# Patient Record
Sex: Male | Born: 1940 | Race: White | Hispanic: No | Marital: Single | Smoking: Current some day smoker
Health system: Southern US, Community
[De-identification: ages and names within clinical notes are randomized; demographics above are authoritative.]

## PROBLEM LIST (undated history)

## (undated) DIAGNOSIS — R6510 Systemic inflammatory response syndrome (SIRS) of non-infectious origin without acute organ dysfunction: Secondary | ICD-10-CM

## (undated) DIAGNOSIS — R5381 Other malaise: Secondary | ICD-10-CM

## (undated) DIAGNOSIS — E871 Hypo-osmolality and hyponatremia: Secondary | ICD-10-CM

## (undated) DIAGNOSIS — I10 Essential (primary) hypertension: Secondary | ICD-10-CM

## (undated) DIAGNOSIS — I509 Heart failure, unspecified: Secondary | ICD-10-CM

## (undated) DIAGNOSIS — A4901 Methicillin susceptible Staphylococcus aureus infection, unspecified site: Secondary | ICD-10-CM

## (undated) DIAGNOSIS — I739 Peripheral vascular disease, unspecified: Secondary | ICD-10-CM

## (undated) DIAGNOSIS — E872 Acidosis, unspecified: Secondary | ICD-10-CM

## (undated) DIAGNOSIS — J189 Pneumonia, unspecified organism: Secondary | ICD-10-CM

## (undated) DIAGNOSIS — F1020 Alcohol dependence, uncomplicated: Secondary | ICD-10-CM

## (undated) DIAGNOSIS — E876 Hypokalemia: Secondary | ICD-10-CM

## (undated) DIAGNOSIS — L97909 Non-pressure chronic ulcer of unspecified part of unspecified lower leg with unspecified severity: Secondary | ICD-10-CM

## (undated) DIAGNOSIS — J449 Chronic obstructive pulmonary disease, unspecified: Secondary | ICD-10-CM

## (undated) DIAGNOSIS — A419 Sepsis, unspecified organism: Secondary | ICD-10-CM

## (undated) HISTORY — DX: Pneumonia, unspecified organism: J18.9

## (undated) HISTORY — DX: Acidosis, unspecified: E87.20

## (undated) HISTORY — DX: Other malaise: R53.81

## (undated) HISTORY — DX: Hypokalemia: E87.6

## (undated) HISTORY — DX: Heart failure, unspecified: I50.9

## (undated) HISTORY — PX: BELOW KNEE LEG AMPUTATION: SUR23

## (undated) HISTORY — DX: Non-pressure chronic ulcer of unspecified part of unspecified lower leg with unspecified severity: L97.909

## (undated) HISTORY — DX: Chronic obstructive pulmonary disease, unspecified: J44.9

## (undated) HISTORY — DX: Sepsis, unspecified organism: A41.9

## (undated) HISTORY — DX: Systemic inflammatory response syndrome (sirs) of non-infectious origin without acute organ dysfunction: R65.10

## (undated) HISTORY — DX: Methicillin susceptible Staphylococcus aureus infection, unspecified site: A49.01

## (undated) HISTORY — DX: Alcohol dependence, uncomplicated: F10.20

## (undated) HISTORY — DX: Peripheral vascular disease, unspecified: I73.9

## (undated) HISTORY — DX: Hypo-osmolality and hyponatremia: E87.1

## (undated) HISTORY — DX: Acidosis: E87.2

## (undated) HISTORY — DX: Essential (primary) hypertension: I10

---

## 2009-07-30 ENCOUNTER — Inpatient Hospital Stay: Payer: Self-pay | Admitting: Student

## 2011-02-07 ENCOUNTER — Inpatient Hospital Stay: Payer: Self-pay | Admitting: Internal Medicine

## 2011-02-07 DIAGNOSIS — R0602 Shortness of breath: Secondary | ICD-10-CM

## 2011-02-07 DIAGNOSIS — I059 Rheumatic mitral valve disease, unspecified: Secondary | ICD-10-CM

## 2011-02-07 DIAGNOSIS — R7989 Other specified abnormal findings of blood chemistry: Secondary | ICD-10-CM

## 2011-02-26 ENCOUNTER — Ambulatory Visit: Payer: Self-pay | Admitting: Cardiovascular Disease

## 2011-07-06 ENCOUNTER — Emergency Department: Payer: Self-pay | Admitting: Emergency Medicine

## 2011-08-02 ENCOUNTER — Encounter: Payer: Self-pay | Admitting: Cardiovascular Disease

## 2011-08-02 ENCOUNTER — Ambulatory Visit (INDEPENDENT_AMBULATORY_CARE_PROVIDER_SITE_OTHER): Payer: Medicare Other | Admitting: Cardiovascular Disease

## 2011-08-02 VITALS — BP 141/75 | HR 76 | Ht 72.0 in | Wt 200.0 lb

## 2011-08-02 DIAGNOSIS — E119 Type 2 diabetes mellitus without complications: Secondary | ICD-10-CM | POA: Insufficient documentation

## 2011-08-02 DIAGNOSIS — I252 Old myocardial infarction: Secondary | ICD-10-CM

## 2011-08-02 DIAGNOSIS — I238 Other current complications following acute myocardial infarction: Secondary | ICD-10-CM

## 2011-08-02 DIAGNOSIS — I236 Thrombosis of atrium, auricular appendage, and ventricle as current complications following acute myocardial infarction: Secondary | ICD-10-CM | POA: Insufficient documentation

## 2011-08-02 DIAGNOSIS — E785 Hyperlipidemia, unspecified: Secondary | ICD-10-CM

## 2011-08-02 DIAGNOSIS — I2510 Atherosclerotic heart disease of native coronary artery without angina pectoris: Secondary | ICD-10-CM

## 2011-08-02 MED ORDER — ISOSORBIDE MONONITRATE ER 30 MG PO TB24: 30.0000 mg | ORAL_TABLET | Freq: Every day | ORAL | Status: DC

## 2011-08-02 NOTE — Patient Instructions (Signed)
Please start the isosorbide one a day Please call us if you have new issues that need to be addressed before your next appt.  We will call you for a follow up Appt. In 3 months

## 2011-08-02 NOTE — Assessment & Plan Note (Signed)
We have encouraged continued exercise, careful diet management in an effort to lose weight. 

## 2011-08-02 NOTE — Assessment & Plan Note (Signed)
Severe three-vessel coronary artery disease. He refused bypass surgery. We'll try to obtain the cardiac catheter films from [facility] to determine if any intervention can be performed. Currently he is without significant symptoms though he is relatively immobile. We will start isosorbide mononitrate for blood pressure and for severe disease.

## 2011-08-02 NOTE — Assessment & Plan Note (Signed)
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

## 2011-08-02 NOTE — Assessment & Plan Note (Signed)
Occluded mid LAD. Occluded diagonal vessel. Ejection fraction 30% with akinesis of the apical wall and thrombus formation. Currently on warfarin. He is on spironolactone, ACE inhibitor, beta blocker.

## 2011-08-02 NOTE — Assessment & Plan Note (Signed)
He currently on Lipitor 80 mg daily. He will need a cholesterol check in several months time.

## 2011-08-02 NOTE — Progress Notes (Signed)
Patient ID: Strider Vallance, male    DOB: 08-24-1941, 70 y.o.   MRN: [id number]  HPI Comments: Mr. Goin is a pleasant 70 year old gentleman with a long history of diabetes, smoking who presented to [facility] with chest discomfort, transferred to [facility] where he underwent numerous tests, including cardiac catheterization that showed severe three-vessel disease. He presents to our office to establish care.  Cardiac catheter details show moderate diffuse RCA disease with severe distal RCA disease, 90% mid left circumflex disease with diffusely diseased OM1 disease, 60% disease of an OM 2 vessel as large, occluded mid LAD, occluded D1, 90% D2 disease. Ejection fraction 30%.  He was also found to have significant atherosclerotic disease of the abdominal aorta.  Also found to have severe left subclavian stenosis ( significant blood pressure drop on the left compared to the right)  He declined bypass surgery as he has had an amputation in the past and is trying to avoid additional invasive surgery. Stenting was not offered. He was found to have moderate LVH there was concentric, apical thrombus on echo, normal right ventricular systolic pressures  Currently he does not have significant symptoms of chest pain or shortness of breath. Is not very active given his amputation. He does not wear his leg prosthesis for a much and spends most of his time sitting  EKG shows normal sinus rhythm with rate 77 beats per minute with old anterior infarct, ST and T wave abnormality in leads V5, V6, 2, 3, aVF   Outpatient Encounter Prescriptions as of 08/02/2011  Medication Sig Dispense Refill  . aspirin 81 MG tablet Take 81 mg by mouth daily.        Marland Kitchen atorvastatin (LIPITOR) 80 MG tablet Take 80 mg by mouth daily.        . beta carotene 08657 UNIT capsule Take 25,000 Units by mouth daily.        . carvedilol (COREG) 6.25 MG tablet Take 6.25 mg by mouth 2 (two) times daily with a meal.        . cholecalciferol  (VITAMIN D) 1000 UNITS tablet Take 1,000 Units by mouth daily.        Marland Kitchen docusate sodium (COLACE) 100 MG capsule Take 100 mg by mouth 2 (two) times daily.        . enalapril (VASOTEC) 10 MG tablet Take 10 mg by mouth daily.        Marland Kitchen enoxaparin (LOVENOX) 80 MG/0.8ML SOLN Inject into the skin every 12 (twelve) hours. For 5 days.       . furosemide (LASIX) 40 MG tablet Take 40 mg by mouth daily.        Marland Kitchen glipiZIDE (GLUCOTROL) 10 MG 24 hr tablet Take 10 mg by mouth daily.        . metFORMIN (GLUCOPHAGE) 500 MG tablet Take 500 mg by mouth 2 (two) times daily with a meal.        . nicotine (NICODERM CQ - DOSED IN MG/24 HOURS) 14 mg/24hr patch Place 1 patch onto the skin daily.        . nitroGLYCERIN (NITROSTAT) 0.4 MG SL tablet Place 0.4 mg under the tongue every 5 (five) minutes as needed.        Marland Kitchen spironolactone (ALDACTONE) 25 MG tablet Take 25 mg by mouth daily.        Marland Kitchen warfarin (COUMADIN) 7.5 MG tablet Take 7.5 mg by mouth as directed.           Review of Systems  Constitutional: Negative.   HENT: Negative.   Eyes: Negative.   Respiratory: Positive for shortness of breath.   Cardiovascular: Negative.   Gastrointestinal: Negative.   Musculoskeletal: Positive for gait problem.  Skin: Negative.   Neurological: Negative.   Hematological: Negative.   Psychiatric/Behavioral: Negative.   All other systems reviewed and are negative.    BP 141/75  Pulse 76  Ht 6' (1.829 m)  Wt 200 lb (90.719 kg)  BMI 27.12 kg/m2  Physical Exam  Nursing note and vitals reviewed. Constitutional: He is oriented to person, place, and time. He appears well-developed and well-nourished.       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  Nose: Nose normal.  Mouth/Throat: Oropharynx is clear and moist.  Eyes: Conjunctivae are normal. Pupils are equal, round, and reactive to light.  Neck: Normal range of motion. Neck supple. No JVD  present.  Cardiovascular: Normal rate, regular rhythm, S1 normal, S2 normal, normal heart sounds and intact distal pulses.  Exam reveals no gallop and no friction rub.   No murmur heard. Pulses:      Carotid pulses are 2+ on the right side, and 2+ on the left side.      Radial pulses are 2+ on the right side, and 1+ on the left side.       Dorsalis pedis pulses are 1+ on the right side, and 1+ on the left side.       Posterior tibial pulses are 1+ on the right side, and 1+ on the left side.  Pulmonary/Chest: Effort normal and breath sounds normal. No respiratory distress. He has no wheezes. He has no rales. He exhibits no tenderness.  Abdominal: Soft. Bowel sounds are normal. He exhibits no distension. There is no tenderness.  Musculoskeletal: Normal range of motion. He exhibits no edema and no tenderness.  Lymphadenopathy:    He has no cervical adenopathy.  Neurological: He is alert and oriented to person, place, and time. Coordination normal.  Skin: Skin is warm and dry. No rash noted. No erythema.  Psychiatric: He has a normal mood and affect. His behavior is normal. Judgment and thought content normal.           Assessment and Plan

## 2011-11-28 ENCOUNTER — Encounter: Payer: Self-pay | Admitting: Cardiovascular Disease

## 2011-11-28 ENCOUNTER — Inpatient Hospital Stay: Payer: Self-pay | Admitting: Student

## 2011-11-29 ENCOUNTER — Encounter: Payer: Self-pay | Admitting: Cardiovascular Disease

## 2011-11-29 DIAGNOSIS — R079 Chest pain, unspecified: Secondary | ICD-10-CM

## 2011-11-29 DIAGNOSIS — R0602 Shortness of breath: Secondary | ICD-10-CM

## 2011-11-30 ENCOUNTER — Encounter: Payer: Self-pay | Admitting: Cardiovascular Disease

## 2011-11-30 DIAGNOSIS — I2510 Atherosclerotic heart disease of native coronary artery without angina pectoris: Secondary | ICD-10-CM

## 2011-12-04 ENCOUNTER — Telehealth: Payer: Self-pay | Admitting: Cardiovascular Disease

## 2011-12-04 NOTE — Telephone Encounter (Signed)
Pt wants Dr Mariah Milling to call in all meds. Dr Mariah Milling told pt he would if he needed them. All generic sent to [facility] in [place]    Coreg Lasix Spironolactone  Coumadin

## 2011-12-05 MED ORDER — FUROSEMIDE 40 MG PO TABS: 40.0000 mg | ORAL_TABLET | Freq: Every day | ORAL | Status: DC

## 2011-12-05 MED ORDER — CARVEDILOL 6.25 MG PO TABS: 6.2500 mg | ORAL_TABLET | Freq: Two times a day (BID) | ORAL | Status: DC

## 2011-12-05 MED ORDER — SPIRONOLACTONE 25 MG PO TABS: 25.0000 mg | ORAL_TABLET | Freq: Every day | ORAL | Status: DC

## 2011-12-05 MED ORDER — WARFARIN SODIUM 7.5 MG PO TABS: 7.5000 mg | ORAL_TABLET | ORAL | Status: DC

## 2011-12-05 NOTE — Telephone Encounter (Signed)
Dr. Mariah Milling saw pt in hospital, he verified that I can send in Rx refills. Per TG have also scheduled f/u here post hospital. We have asked that he sign release for cardiac cath from [facility], we will need them to mail to our office, pt says he is not going down there any more.

## 2011-12-06 ENCOUNTER — Telehealth: Payer: Self-pay | Admitting: Cardiovascular Disease

## 2011-12-06 NOTE — Telephone Encounter (Signed)
Has a question regarding his Coumadin dosage

## 2011-12-06 NOTE — Telephone Encounter (Signed)
Patient thinks that his Coumadin dosage may be incorrect.  He wants you to call him back to make sure he is taking the right dosage.

## 2011-12-06 NOTE — Telephone Encounter (Signed)
Pt was changed to 4mg  Coumadin in [facility], he restarted on 12/04/11. Pt just got his refill and got the 7.5 mg tablets, he is asking what to take. Per Dr. Mariah Milling, advised pt ok to take 1/2 tab of the 7.5mg  daily and spoke to Dr. Doristine Church office at [facility] who normally follow's pt's coumadin. They know that Dr. Mariah Milling wants his range lower since hematuria in hospital. Pt will f/u at Dr. Doristine Church office.

## 2011-12-27 ENCOUNTER — Encounter: Payer: Self-pay | Admitting: Cardiovascular Disease

## 2011-12-27 ENCOUNTER — Ambulatory Visit (INDEPENDENT_AMBULATORY_CARE_PROVIDER_SITE_OTHER): Payer: Medicare Other | Admitting: Cardiovascular Disease

## 2011-12-27 DIAGNOSIS — I238 Other current complications following acute myocardial infarction: Secondary | ICD-10-CM

## 2011-12-27 DIAGNOSIS — I2510 Atherosclerotic heart disease of native coronary artery without angina pectoris: Secondary | ICD-10-CM

## 2011-12-27 DIAGNOSIS — E119 Type 2 diabetes mellitus without complications: Secondary | ICD-10-CM

## 2011-12-27 DIAGNOSIS — I252 Old myocardial infarction: Secondary | ICD-10-CM

## 2011-12-27 DIAGNOSIS — E785 Hyperlipidemia, unspecified: Secondary | ICD-10-CM

## 2011-12-27 DIAGNOSIS — R319 Hematuria, unspecified: Secondary | ICD-10-CM | POA: Insufficient documentation

## 2011-12-27 DIAGNOSIS — I236 Thrombosis of atrium, auricular appendage, and ventricle as current complications following acute myocardial infarction: Secondary | ICD-10-CM

## 2011-12-27 DIAGNOSIS — I739 Peripheral vascular disease, unspecified: Secondary | ICD-10-CM | POA: Insufficient documentation

## 2011-12-27 MED ORDER — SPIRONOLACTONE 25 MG PO TABS: 25.0000 mg | ORAL_TABLET | Freq: Every day | ORAL | Status: DC

## 2011-12-27 MED ORDER — ATORVASTATIN CALCIUM 80 MG PO TABS: 80.0000 mg | ORAL_TABLET | Freq: Every day | ORAL | Status: DC

## 2011-12-27 MED ORDER — WARFARIN SODIUM 4 MG PO TABS: 4.0000 mg | ORAL_TABLET | Freq: Every day | ORAL | Status: AC

## 2011-12-27 MED ORDER — ENALAPRIL MALEATE 10 MG PO TABS: 10.0000 mg | ORAL_TABLET | Freq: Two times a day (BID) | ORAL | Status: DC

## 2011-12-27 NOTE — Assessment & Plan Note (Signed)
We have encouraged  careful diet management   

## 2011-12-27 NOTE — Assessment & Plan Note (Signed)
He had recent hematuria on warfarin, 2 weeks, stuttering prior to his recent admission to [facility] in early January. We have decreased the warfarin in half to 4 mg daily with aspirin.

## 2011-12-27 NOTE — Progress Notes (Signed)
Patient ID: Kyle [facility], male    DOB: 09/16/1941, 72 y.o.   MRN: [id number]  HPI Comments: Kyle Duke is a pleasant 71 year old gentleman with a long history of diabetes, smoking who presented to [facility] with chest discomfort, transferred to [facility] where he underwent numerous tests, including cardiac catheterization that showed severe three-vessel disease. He presented to [facility] and was admitted at the beginning of January 2013 for hematuria, shortness of breath, better on oxygen. He was started on heparin and was noted to have significant hematuria. Heparin was discontinued and his warfarin was cut in half and his hematuria resolved. He had a non-STEMI, sinus tachycardia on arrival. Hemoglobin dropped as low as 8.4. Followup troponin was 5.2. He was discharged with medical management given severe three-vessel disease by catheterization at [facility] with recommendation at that time for medical management. I believe he declined bypass surgery. He was found to have an apical thrombus on echo.  Cardiac catheter details show moderate diffuse RCA disease with severe distal RCA disease, 90% mid left circumflex disease with diffusely diseased OM1 disease, 60% disease of an OM 2 vessel as large, occluded mid LAD, occluded D1, 90% D2 disease. Ejection fraction 30%. He was also found to have significant atherosclerotic disease of the abdominal aorta. Also found to have severe left subclavian stenosis ( significant blood pressure drop on the left compared to the right)  He declined bypass surgery as he has had an amputation in the past and is trying to avoid additional invasive surgery. Stenting was not offered. He was found to have moderate LVH there was concentric, apical thrombus on echo, normal right ventricular systolic pressures  Currently he does not have significant symptoms of chest pain or shortness of breath. Is not very active given his amputation. He does not wear his leg prosthesis for a much and  spends most of his time sitting. He denies any significant shortness of breath. He does have mild lower extremity edema on the left. He continues to smoke several cigarettes per day.  EKG shows normal sinus rhythm with rate 73 beats per minute with old anterior infarct, left anterior fascicular block  Echocardiogram in the hospital showed ejection fraction 35-40%, moderate to severe apical wall hypokinesis, moderate anterior wall hypokinesis, normal right ventricular systolic pressures    Outpatient Encounter Prescriptions as of 12/27/2011  Medication Sig Dispense Refill  . aspirin 81 MG tablet Take 160 mg by mouth daily.       Marland Kitchen atorvastatin (LIPITOR) 80 MG tablet Take 1 tablet (80 mg total) by mouth daily.  90 tablet  3  . beta carotene 78469 UNIT capsule Take 25,000 Units by mouth daily.        . carvedilol (COREG) 6.25 MG tablet Take 1 tablet (6.25 mg total) by mouth 2 (two) times daily with a meal.  60 tablet  6  . cholecalciferol (VITAMIN D) 1000 UNITS tablet Take 1,000 Units by mouth daily.        Marland Kitchen docusate sodium (COLACE) 100 MG capsule Take 100 mg by mouth 2 (two) times daily.        . enalapril (VASOTEC) 10 MG tablet Take 1 tablet (10 mg total) by mouth every 12 (twelve) hours.  180 tablet  3  . furosemide (LASIX) 40 MG tablet Take 1 tablet (40 mg total) by mouth daily.  30 tablet  6  . glipiZIDE (GLUCOTROL) 10 MG 24 hr tablet Take 10 mg by mouth daily.        Marland Kitchen  nitroGLYCERIN (NITROSTAT) 0.4 MG SL tablet Place 0.4 mg under the tongue every 5 (five) minutes as needed.        Marland Kitchen spironolactone (ALDACTONE) 25 MG tablet Take 1 tablet (25 mg total) by mouth daily.  90 tablet  3  . warfarin (COUMADIN) 4 MG tablet Take 1 tablet (4 mg total) by mouth daily.  90 tablet  3     Review of Systems  Constitutional: Negative.   HENT: Negative.   Eyes: Negative.   Cardiovascular: Negative.   Gastrointestinal: Negative.   Musculoskeletal: Positive for gait problem.  Skin: Negative.     Neurological: Negative.   Hematological: Negative.   Psychiatric/Behavioral: Negative.   All other systems reviewed and are negative.    BP 159/78  Pulse 73  Ht 6\' 2"  (1.88 m)  Wt 200 lb (90.719 kg)  BMI 25.68 kg/m2  SpO2 97%  Physical Exam  Nursing note and vitals reviewed. Constitutional: He is oriented to person, place, and time. He appears well-developed and well-nourished.       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  Nose: Nose normal.  Mouth/Throat: Oropharynx is clear and moist.  Eyes: Conjunctivae are normal. Pupils are equal, round, and reactive to light.  Neck: Normal range of motion. Neck supple. No JVD present.  Cardiovascular: Normal rate, regular rhythm, S1 normal, S2 normal, normal heart sounds and intact distal pulses.  Exam reveals no gallop and no friction rub.   No murmur heard. Pulses:      Carotid pulses are 2+ on the right side, and 2+ on the left side.      Radial pulses are 2+ on the right side, and 1+ on the left side.       Dorsalis pedis pulses are 1+ on the right side, and 1+ on the left side.       Posterior tibial pulses are 1+ on the right side, and 1+ on the left side.  Pulmonary/Chest: Effort normal. No respiratory distress. He has decreased breath sounds. He has no wheezes. He has no rales. He exhibits no tenderness.  Abdominal: Soft. Bowel sounds are normal. He exhibits no distension. There is no tenderness.  Musculoskeletal: Normal range of motion. He exhibits no edema and no tenderness.  Lymphadenopathy:    He has no cervical adenopathy.  Neurological: He is alert and oriented to person, place, and time. Coordination normal.  Skin: Skin is warm and dry. No rash noted. No erythema.  Psychiatric: He has a normal mood and affect. His behavior is normal. Judgment and thought content normal.           Assessment and Plan

## 2011-12-27 NOTE — Assessment & Plan Note (Signed)
He reports having carotid disease. Will try to obtain the most recent report from [facility] for records. Continue aggressive cholesterol management.

## 2011-12-27 NOTE — Assessment & Plan Note (Signed)
Discharge instructions indicate he is posterior half followup with Dr. Orson Slick at [facility]

## 2011-12-27 NOTE — Assessment & Plan Note (Signed)
Severe CAD, previous cardiac catheterization at [facility] with bypass surgery recommended. He declined bypass surgery. We have obtained a copy of his cardiac catheterization and will review this to determine if any percutaneous intervention could be performed given his recent non-STEMI.

## 2011-12-27 NOTE — Assessment & Plan Note (Signed)
I suggested he continue his cholesterol medication. Goal LDL less than 70.

## 2011-12-27 NOTE — Patient Instructions (Signed)
You are doing well. No medication changes were made.  Please call us if you have new issues that need to be addressed before your next appt.  Your physician wants you to follow-up in: 6 months.  You will receive a reminder letter in the mail two months in advance. If you don't receive a letter, please call our office to schedule the follow-up appointment.   

## 2012-06-25 ENCOUNTER — Ambulatory Visit (INDEPENDENT_AMBULATORY_CARE_PROVIDER_SITE_OTHER): Payer: Medicare Other | Admitting: Cardiovascular Disease

## 2012-06-25 ENCOUNTER — Encounter: Payer: Self-pay | Admitting: Cardiovascular Disease

## 2012-06-25 VITALS — BP 182/90 | HR 83 | Ht 74.0 in | Wt 170.0 lb

## 2012-06-25 DIAGNOSIS — I236 Thrombosis of atrium, auricular appendage, and ventricle as current complications following acute myocardial infarction: Secondary | ICD-10-CM

## 2012-06-25 DIAGNOSIS — I2510 Atherosclerotic heart disease of native coronary artery without angina pectoris: Secondary | ICD-10-CM

## 2012-06-25 DIAGNOSIS — R609 Edema, unspecified: Secondary | ICD-10-CM | POA: Insufficient documentation

## 2012-06-25 DIAGNOSIS — I238 Other current complications following acute myocardial infarction: Secondary | ICD-10-CM

## 2012-06-25 DIAGNOSIS — E785 Hyperlipidemia, unspecified: Secondary | ICD-10-CM

## 2012-06-25 MED ORDER — ENALAPRIL MALEATE 20 MG PO TABS: 20.0000 mg | ORAL_TABLET | Freq: Two times a day (BID) | ORAL | Status: DC

## 2012-06-25 MED ORDER — NEW SKIN EX AERO: INHALATION_SPRAY | CUTANEOUS | Status: AC | PRN

## 2012-06-25 MED ORDER — CARVEDILOL 12.5 MG PO TABS: 12.5000 mg | ORAL_TABLET | Freq: Two times a day (BID) | ORAL | Status: DC

## 2012-06-25 NOTE — Assessment & Plan Note (Signed)
We have suggested he stay on his Lipitor 80 mg daily

## 2012-06-25 NOTE — Assessment & Plan Note (Signed)
Continue on warfarin given previous apical thrombus.

## 2012-06-25 NOTE — Patient Instructions (Addendum)
You are doing well. Please increase the enalapril to 20 mg twice a day Increase the carvedilol to 12.5 mg twice a day  Try a compression hose for leg swelling  Please call us if you have new issues that need to be addressed before your next appt.  Your physician wants you to follow-up in: 6 months.  You will receive a reminder letter in the mail two months in advance. If you don't receive a letter, please call our office to schedule the follow-up appointment.

## 2012-06-25 NOTE — Progress Notes (Signed)
Patient ID: Kyle [facility], male    DOB: 1941/06/16, 71 y.o.   MRN: [id number]  HPI Comments: Mr. Werth is a pleasant 71 year old gentleman with a long history of diabetes, smoking who presented to [facility] with chest discomfort, transferred to [facility] where he underwent numerous tests, including cardiac catheterization that showed severe three-vessel disease. He presented to [facility] and was admitted at the beginning of January 2013 for hematuria, shortness of breath, better on oxygen. He was started on heparin and was noted to have significant hematuria. Heparin was discontinued and his warfarin was cut in half and his hematuria resolved. He had a non-STEMI, sinus tachycardia on arrival. Hemoglobin dropped as low as 8.4. Followup troponin was 5.2. He was discharged with medical management given severe three-vessel disease by catheterization at [facility] with recommendation at that time for medical management. I believe he declined bypass surgery. He was found to have an apical thrombus on echo.  Cardiac catheter details show moderate diffuse RCA disease with severe distal RCA disease, 90% mid left circumflex disease with diffusely diseased OM1 disease, 60% disease of an OM 2 vessel as large, occluded mid LAD, occluded D1, 90% D2 disease. Ejection fraction 30%. He was also found to have significant atherosclerotic disease of the abdominal aorta. Also found to have severe left subclavian stenosis ( significant blood pressure drop on the left compared to the right)  He declined bypass surgery as he has had an amputation in the past and is trying to avoid additional invasive surgery. Stenting was not offered. He was found to have moderate LVH there was concentric, apical thrombus on echo, normal right ventricular systolic pressures  Currently he does not have significant symptoms of chest pain or shortness of breath. Is not very active given his amputation. He does not wear his leg prosthesis for a much and  spends most of his time sitting.He does have mild lower extremity edema on the left that recently he has been getting mildly worse. He does have a healing ulcer on the left leg.  He continues to smoke several cigarettes per day.  EKG shows normal sinus rhythm with rate 84 beats per minute with old anterior infarct, left anterior fascicular block  Echocardiogram in the hospital showed ejection fraction 35-40%, moderate to severe apical wall hypokinesis, moderate anterior wall hypokinesis, normal right ventricular systolic pressures    Outpatient Encounter Prescriptions as of 12/27/2011  Medication Sig Dispense Refill  . aspirin 81 MG tablet Take 160 mg by mouth daily.       Marland Kitchen atorvastatin (LIPITOR) 80 MG tablet Take 1 tablet (80 mg total) by mouth daily.  90 tablet  3  . beta carotene 40981 UNIT capsule Take 25,000 Units by mouth daily.        . carvedilol (COREG) 6.25 MG tablet Take 1 tablet (6.25 mg total) by mouth 2 (two) times daily with a meal.  60 tablet  6  . cholecalciferol (VITAMIN D) 1000 UNITS tablet Take 1,000 Units by mouth daily.        Marland Kitchen docusate sodium (COLACE) 100 MG capsule Take 100 mg by mouth 2 (two) times daily.        . enalapril (VASOTEC) 10 MG tablet Take 1 tablet (10 mg total) by mouth every 12 (twelve) hours.  180 tablet  3  . furosemide (LASIX) 40 MG tablet Take 1 tablet (40 mg total) by mouth daily.  30 tablet  6  . glipiZIDE (GLUCOTROL) 10 MG 24 hr tablet Take 10  mg by mouth daily.        . nitroGLYCERIN (NITROSTAT) 0.4 MG SL tablet Place 0.4 mg under the tongue every 5 (five) minutes as needed.        Marland Kitchen spironolactone (ALDACTONE) 25 MG tablet Take 1 tablet (25 mg total) by mouth daily.  90 tablet  3  . warfarin (COUMADIN) 4 MG tablet Take 1 tablet (4 mg total) by mouth daily.  90 tablet  3     Review of Systems  Constitutional: Negative.   HENT: Negative.   Eyes: Negative.   Respiratory: Negative.   Cardiovascular: Positive for leg swelling.    Gastrointestinal: Negative.   Musculoskeletal: Positive for gait problem.  Skin: Negative.   Neurological: Negative.   Hematological: Negative.   Psychiatric/Behavioral: Negative.   All other systems reviewed and are negative.    BP 182/90  Pulse 83  Ht 6\' 2"  (1.88 m)  Wt 170 lb (77.111 kg)  BMI 21.83 kg/m2  Physical Exam  Nursing note and vitals reviewed. Constitutional: He is oriented to person, place, and time. He appears well-developed and well-nourished.       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  Nose: Nose normal.  Mouth/Throat: Oropharynx is clear and moist.  Eyes: Conjunctivae are normal. Pupils are equal, round, and reactive to light.  Neck: Normal range of motion. Neck supple. No JVD present.  Cardiovascular: Normal rate, regular rhythm, S1 normal, S2 normal, normal heart sounds and intact distal pulses.  Exam reveals no gallop and no friction rub.   No murmur heard. Pulses:      Carotid pulses are 2+ on the right side, and 2+ on the left side.      Radial pulses are 2+ on the right side, and 1+ on the left side.       Dorsalis pedis pulses are 1+ on the right side, and 1+ on the left side.       Posterior tibial pulses are 1+ on the right side, and 1+ on the left side.  Pulmonary/Chest: Effort normal. No respiratory distress. He has decreased breath sounds. He has no wheezes. He has no rales. He exhibits no tenderness.  Abdominal: Soft. Bowel sounds are normal. He exhibits no distension. There is no tenderness.  Musculoskeletal: Normal range of motion. He exhibits no edema and no tenderness.       Right leg amputation below the knee  Lymphadenopathy:    He has no cervical adenopathy.  Neurological: He is alert and oriented to person, place, and time. Coordination normal.  Skin: Skin is warm and dry. No rash noted. No erythema.  Psychiatric: He has a normal mood and affect. His  behavior is normal. Judgment and thought content normal.           Assessment and Plan

## 2012-06-25 NOTE — Assessment & Plan Note (Signed)
We have suggested he wear a TED hose, leg elevation. Continue on his Lasix, decrease his salt and fluid intake. Edema likely from venous insufficiency.

## 2012-06-25 NOTE — Assessment & Plan Note (Signed)
Currently with no symptoms of angina. No further workup at this time. Continue current medication regimen. 

## 2012-08-22 ENCOUNTER — Other Ambulatory Visit: Payer: Self-pay

## 2012-08-22 MED ORDER — FUROSEMIDE 40 MG PO TABS: 40.0000 mg | ORAL_TABLET | Freq: Every day | ORAL | Status: DC

## 2012-08-22 NOTE — Telephone Encounter (Signed)
Refill sent for furosemide  

## 2012-08-22 NOTE — Telephone Encounter (Signed)
Refill sent for furosemide 40 mg take one tablet daily. 

## 2012-09-02 ENCOUNTER — Other Ambulatory Visit: Payer: Self-pay | Admitting: *Deleted

## 2012-09-02 MED ORDER — ISOSORBIDE MONONITRATE ER 30 MG PO TB24: 30.0000 mg | ORAL_TABLET | Freq: Every day | ORAL | Status: DC

## 2012-09-02 NOTE — Telephone Encounter (Signed)
Refilled Isosorbide. 

## 2012-12-04 ENCOUNTER — Other Ambulatory Visit: Payer: Self-pay | Admitting: *Deleted

## 2012-12-04 MED ORDER — ISOSORBIDE MONONITRATE ER 30 MG PO TB24: 30.0000 mg | ORAL_TABLET | Freq: Every day | ORAL | Status: DC

## 2012-12-04 MED ORDER — ATORVASTATIN CALCIUM 80 MG PO TABS: 80.0000 mg | ORAL_TABLET | Freq: Every day | ORAL | Status: DC

## 2012-12-04 NOTE — Telephone Encounter (Signed)
Refilled Atorvastatin sent to [facility].

## 2012-12-04 NOTE — Telephone Encounter (Signed)
Refilled Isosorbide. 

## 2013-01-02 ENCOUNTER — Ambulatory Visit: Payer: Medicare Other | Admitting: Cardiovascular Disease

## 2013-01-13 ENCOUNTER — Other Ambulatory Visit: Payer: Self-pay

## 2013-01-13 MED ORDER — SPIRONOLACTONE 25 MG PO TABS: 25.0000 mg | ORAL_TABLET | Freq: Every day | ORAL | Status: DC

## 2013-01-13 NOTE — Telephone Encounter (Signed)
Refill sent for spironolactone  

## 2013-01-16 ENCOUNTER — Ambulatory Visit: Payer: Medicare Other | Admitting: Cardiovascular Disease

## 2013-01-23 ENCOUNTER — Other Ambulatory Visit: Payer: Self-pay | Admitting: *Deleted

## 2013-01-27 ENCOUNTER — Telehealth: Payer: Self-pay

## 2013-01-27 ENCOUNTER — Other Ambulatory Visit: Payer: Self-pay

## 2013-01-27 MED ORDER — ENALAPRIL MALEATE 20 MG PO TABS: 20.0000 mg | ORAL_TABLET | Freq: Two times a day (BID) | ORAL | Status: DC

## 2013-01-27 NOTE — Telephone Encounter (Signed)
Sent a refill for enalapril 10 mg. Patient needs to come for a follow up appointment with Dr. Mariah Milling.  Patient called back to schedule appointment for February 12, 2013.

## 2013-01-27 NOTE — Telephone Encounter (Signed)
Refill sent for enalapril 10 mg

## 2013-02-23 ENCOUNTER — Ambulatory Visit (INDEPENDENT_AMBULATORY_CARE_PROVIDER_SITE_OTHER): Payer: Medicare Other | Admitting: Cardiovascular Disease

## 2013-02-23 ENCOUNTER — Encounter: Payer: Self-pay | Admitting: Cardiovascular Disease

## 2013-02-23 VITALS — BP 160/78 | HR 82 | Ht 74.0 in | Wt 180.0 lb

## 2013-02-23 DIAGNOSIS — E785 Hyperlipidemia, unspecified: Secondary | ICD-10-CM

## 2013-02-23 DIAGNOSIS — I238 Other current complications following acute myocardial infarction: Secondary | ICD-10-CM

## 2013-02-23 DIAGNOSIS — I2510 Atherosclerotic heart disease of native coronary artery without angina pectoris: Secondary | ICD-10-CM

## 2013-02-23 DIAGNOSIS — I10 Essential (primary) hypertension: Secondary | ICD-10-CM

## 2013-02-23 DIAGNOSIS — I236 Thrombosis of atrium, auricular appendage, and ventricle as current complications following acute myocardial infarction: Secondary | ICD-10-CM

## 2013-02-23 DIAGNOSIS — R609 Edema, unspecified: Secondary | ICD-10-CM

## 2013-02-23 NOTE — Assessment & Plan Note (Signed)
I've asked Him to closely monitor his blood pressure at home and call for systolic pressures greater than 40 on a regular basis

## 2013-02-23 NOTE — Assessment & Plan Note (Signed)
Edema has resolved with use of Lasix. We'll continue dose daily. He has lab work done through primary care physician. If creatinine and BUN started to climb, we have suggested he decrease the diuretic use

## 2013-02-23 NOTE — Assessment & Plan Note (Signed)
We have suggested he stay on his statin. Goal LDL less than 70. Most recent numbers are not available

## 2013-02-23 NOTE — Assessment & Plan Note (Signed)
Currently with no symptoms of angina. No further workup at this time. Continue current medication regimen. 

## 2013-02-23 NOTE — Patient Instructions (Addendum)
You are doing well. No medication changes were made.  Ask your primary care about Megace  If kidney numbers goes high, cut back on diuretic   Please call us if you have new issues that need to be addressed before your next appt.  Your physician wants you to follow-up in: 6 months.  You will receive a reminder letter in the mail two months in advance. If you don't receive a letter, please call our office to schedule the follow-up appointment.

## 2013-02-23 NOTE — Progress Notes (Signed)
Patient ID: Kyle Duke, male    DOB: April 30, 1941, 72 y.o.   MRN: [id number]  HPI Comments: Kyle Duke is a pleasant 72 year-old gentleman with a long history of diabetes, smoking who presented to [facility] with chest discomfort, transferred to [facility] where he underwent numerous tests, including cardiac catheterization that showed severe three-vessel disease. He presented to [facility] and was admitted at the beginning of January 2013 for hematuria, shortness of breath, better on oxygen. He was started on heparin and was noted to have significant hematuria. Heparin was discontinued and his warfarin was cut in half and his hematuria resolved. He had a non-STEMI, sinus tachycardia on arrival. Hemoglobin dropped as low as 8.4. Followup troponin was 5.2. He was discharged with medical management given severe three-vessel disease by catheterization at [facility] with recommendation at that time for medical management. I believe he declined bypass surgery. He was found to have an apical thrombus on echo.  Cardiac catheter details show moderate diffuse RCA disease with severe distal RCA disease, 90% mid left circumflex disease with diffusely diseased OM1 disease, 60% disease of an OM 2 vessel as large, occluded mid LAD, occluded D1, 90% D2 disease. Ejection fraction 30%. He was also found to have significant atherosclerotic disease of the abdominal aorta. Also found to have severe left subclavian stenosis ( significant blood pressure drop on the left compared to the right)  He declined bypass surgery as he has had an amputation in the past and is trying to avoid additional invasive surgery. Stenting was not offered. He was found to have moderate LVH there was concentric, apical thrombus on echo, normal right ventricular systolic pressures  Currently he does not have significant symptoms of chest pain or shortness of breath. Is not very active given his amputation. He does not wear his leg prosthesis and spends most  of his time sitting.  He reports that he stop smoking, now uses electronic cigarette.  EKG shows normal sinus rhythm with rate 82 beats per minute with old anterior infarct, left anterior fascicular block  Echocardiogram in the hospital showed ejection fraction 35-40%, moderate to severe apical wall hypokinesis, moderate anterior wall hypokinesis, normal right ventricular systolic pressures    Outpatient Encounter Prescriptions as of 02/23/2013  Medication Sig Dispense Refill  . aspirin 162 MG EC tablet Take 162 mg by mouth daily.      Marland Kitchen atorvastatin (LIPITOR) 80 MG tablet Take 1 tablet (80 mg total) by mouth daily.  90 tablet  3  . beta carotene 95621 UNIT capsule Take 25,000 Units by mouth daily.        . carvedilol (COREG) 12.5 MG tablet Take 1 tablet (12.5 mg total) by mouth 2 (two) times daily with a meal.  180 tablet  3  . enalapril (VASOTEC) 20 MG tablet Take 1 tablet (20 mg total) by mouth 2 (two) times daily.  180 tablet  3  . furosemide (LASIX) 40 MG tablet Take 1 tablet (40 mg total) by mouth daily.  30 tablet  6  . HYDROcodone-acetaminophen (NORCO) 10-325 MG per tablet Take 2 tablets by mouth as needed.       . isosorbide mononitrate (IMDUR) 30 MG 24 hr tablet Take 1 tablet (30 mg total) by mouth daily.  30 tablet  3  . nitrocellulose (NEW SKIN) spray Apply topically as needed for dry skin.  30 mL  12  . nitroGLYCERIN (NITROSTAT) 0.4 MG SL tablet Place 0.4 mg under the tongue every 5 (five) minutes as  needed.        Marland Kitchen spironolactone (ALDACTONE) 25 MG tablet Take 1 tablet (25 mg total) by mouth daily.  90 tablet  3  . warfarin (COUMADIN) 4 MG tablet Take 1 tablet (4 mg total) by mouth daily.  90 tablet  3   Review of Systems  Constitutional: Negative.   HENT: Negative.   Eyes: Negative.   Respiratory: Negative.   Cardiovascular: Positive for leg swelling.  Gastrointestinal: Negative.   Musculoskeletal: Positive for gait problem.  Skin: Negative.   Neurological: Negative.    Psychiatric/Behavioral: Negative.   All other systems reviewed and are negative.    BP 160/78  Pulse 82  Ht 6\' 2"  (1.88 m)  Wt 180 lb (81.647 kg)  BMI 23.1 kg/m2  Physical Exam  Nursing note and vitals reviewed. Constitutional: He is oriented to person, place, and time. He appears well-developed and well-nourished.  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  Nose: Nose normal.  Mouth/Throat: Oropharynx is clear and moist.  Eyes: Conjunctivae are normal. Pupils are equal, round, and reactive to light.  Neck: Normal range of motion. Neck supple. No JVD present.  Cardiovascular: Normal rate, regular rhythm, S1 normal, S2 normal, normal heart sounds and intact distal pulses.  Exam reveals no gallop and no friction rub.   No murmur heard. Pulses:      Carotid pulses are 2+ on the right side, and 2+ on the left side.      Radial pulses are 2+ on the right side, and 1+ on the left side.       Dorsalis pedis pulses are 1+ on the right side, and 1+ on the left side.       Posterior tibial pulses are 1+ on the right side, and 1+ on the left side.  Pulmonary/Chest: Effort normal. No respiratory distress. He has decreased breath sounds. He has no wheezes. He has no rales. He exhibits no tenderness.  Abdominal: Soft. Bowel sounds are normal. He exhibits no distension. There is no tenderness.  Musculoskeletal: Normal range of motion. He exhibits no edema and no tenderness.  Right leg amputation below the knee  Lymphadenopathy:    He has no cervical adenopathy.  Neurological: He is alert and oriented to person, place, and time. Coordination normal.  Skin: Skin is warm and dry. No rash noted. No erythema.  Psychiatric: He has a normal mood and affect. His behavior is normal. Judgment and thought content normal.      Assessment and Plan

## 2013-02-23 NOTE — Assessment & Plan Note (Signed)
Currently on warfarin with no complications 

## 2013-04-23 ENCOUNTER — Inpatient Hospital Stay: Payer: Self-pay | Admitting: Internal Medicine

## 2013-04-24 DIAGNOSIS — I5023 Acute on chronic systolic (congestive) heart failure: Secondary | ICD-10-CM

## 2013-05-22 ENCOUNTER — Other Ambulatory Visit: Payer: Self-pay

## 2013-05-22 MED ORDER — ISOSORBIDE MONONITRATE ER 30 MG PO TB24: 30.0000 mg | ORAL_TABLET | Freq: Every day | ORAL | Status: DC

## 2013-05-22 NOTE — Telephone Encounter (Signed)
Refill sent for isosorbide mono 30 mg take one tablet daily.  

## 2013-06-24 ENCOUNTER — Other Ambulatory Visit: Payer: Self-pay

## 2013-06-24 MED ORDER — ENALAPRIL MALEATE 20 MG PO TABS: 20.0000 mg | ORAL_TABLET | Freq: Two times a day (BID) | ORAL | Status: DC

## 2013-06-24 NOTE — Telephone Encounter (Signed)
Refill sent for enalapril maleate 10 mg

## 2013-07-09 ENCOUNTER — Ambulatory Visit (INDEPENDENT_AMBULATORY_CARE_PROVIDER_SITE_OTHER): Payer: Medicare Other | Admitting: Cardiovascular Disease

## 2013-07-09 ENCOUNTER — Encounter: Payer: Self-pay | Admitting: Cardiovascular Disease

## 2013-07-09 VITALS — BP 135/70 | HR 79 | Ht 74.0 in | Wt 175.0 lb

## 2013-07-09 DIAGNOSIS — I509 Heart failure, unspecified: Secondary | ICD-10-CM

## 2013-07-09 DIAGNOSIS — E785 Hyperlipidemia, unspecified: Secondary | ICD-10-CM

## 2013-07-09 DIAGNOSIS — I2510 Atherosclerotic heart disease of native coronary artery without angina pectoris: Secondary | ICD-10-CM

## 2013-07-09 DIAGNOSIS — I5022 Chronic systolic (congestive) heart failure: Secondary | ICD-10-CM | POA: Insufficient documentation

## 2013-07-09 DIAGNOSIS — I236 Thrombosis of atrium, auricular appendage, and ventricle as current complications following acute myocardial infarction: Secondary | ICD-10-CM

## 2013-07-09 DIAGNOSIS — I238 Other current complications following acute myocardial infarction: Secondary | ICD-10-CM

## 2013-07-09 DIAGNOSIS — R0602 Shortness of breath: Secondary | ICD-10-CM

## 2013-07-09 DIAGNOSIS — J449 Chronic obstructive pulmonary disease, unspecified: Secondary | ICD-10-CM | POA: Insufficient documentation

## 2013-07-09 MED ORDER — ALBUTEROL SULFATE HFA 108 (90 BASE) MCG/ACT IN AERS: 2.0000 | INHALATION_SPRAY | Freq: Four times a day (QID) | RESPIRATORY_TRACT | Status: AC | PRN

## 2013-07-09 MED ORDER — FUROSEMIDE 40 MG PO TABS: 40.0000 mg | ORAL_TABLET | Freq: Two times a day (BID) | ORAL | Status: DC

## 2013-07-09 NOTE — Assessment & Plan Note (Signed)
Severe three-vessel CAD. Medical management. No further testing at this time. No active angina.

## 2013-07-09 NOTE — Assessment & Plan Note (Signed)
Recent admission for bronchitis or pneumonia. Improved on Levaquin and prednisone. Did not qualify for oxygen today

## 2013-07-09 NOTE — Patient Instructions (Addendum)
You have leg edema from CHF Please take lasix 40 mg twice a day  For empysema,  Please take albuterol as needed  Please call us if you have new issues that need to be addressed before your next appt.  Your physician wants you to follow-up in: 3 months.  You will receive a reminder letter in the mail two months in advance. If you don't receive a letter, please call our office to schedule the follow-up appointment.

## 2013-07-09 NOTE — Assessment & Plan Note (Signed)
Right pleural effusion on exam today with left lower STEMI edema. We have suggested he increase his Lasix to 40 mg twice a day. This will help his cough, and asked him to drop back on the Lasix to 40 mg daily when edema resolves

## 2013-07-09 NOTE — Assessment & Plan Note (Signed)
He is currently on warfarin.

## 2013-07-09 NOTE — Assessment & Plan Note (Signed)
Most recent lipid panel not available. Asked him to continue his Lipitor

## 2013-07-09 NOTE — Progress Notes (Signed)
Patient ID: Kyle Duke, male    DOB: [birth year], 72 y.o.   MRN: [id number]  HPI Comments: Kyle Duke is a pleasant 72 year-old gentleman with a long history of diabetes, smoking, three-vessel CAD, amputation of the right leg, previously declined CABG, who presents for routine followup,  Recent hospital admission 05/18/2013 for COPD exacerbation with improvement on prednisone and Levaquin . Currently he does not have significant symptoms of chest pain. He does have mild shortness of breath. Leg edema on the left. Still with a cough that is clear.  Is not very active given his amputation. He does not wear his leg prosthesis and spends most of his time sitting.  He reports that he stop smoking, now uses electronic cigarette. He uses his girlfriend's oxygen when necessary. Would like to get oxygen at his own  Previous admission to [facility] with chest discomfort, transferred to [facility] where he underwent numerous tests, including cardiac catheterization that showed severe three-vessel disease. He presented to [facility] January 2013 for hematuria, shortness of breath, better on oxygen. He was started on heparin and was noted to have significant hematuria. Heparin was discontinued and his warfarin was cut in half and his hematuria resolved. He had a non-STEMI, sinus tachycardia on arrival. Hemoglobin dropped as low as 8.4. Followup troponin was 5.2. He was discharged with medical management given severe three-vessel disease by catheterization at [facility] with recommendation at that time for medical management.  he declined bypass surgery. found to have an apical thrombus on echo.  Previous Cardiac catheter details show moderate diffuse RCA disease with severe distal RCA disease, 90% mid left circumflex disease with diffusely diseased OM1 disease, 60% disease of an OM 2 vessel as large, occluded mid LAD, occluded D1, 90% D2 disease. Ejection fraction 30%. He was also found to have significant atherosclerotic  disease of the abdominal aorta. Also found to have severe left subclavian stenosis ( significant blood pressure drop on the left compared to the right)  He declined bypass surgery as he has had an amputation in the past and is "trying to avoid additional invasive surgery". Stenting was not offered. He was found to have moderate LVH there was  apical thrombus on echo, normal right ventricular systolic pressures  EKG shows normal sinus rhythm with rate 79 beats per minute with old anterior infarct, left anterior fascicular block  Echocardiogram January 2013 in the hospital showed ejection fraction 35-40%, moderate to severe apical wall hypokinesis, moderate anterior wall hypokinesis, normal right ventricular systolic pressures    Outpatient Encounter Prescriptions as of 07/09/2013  Medication Sig Dispense Refill  . aspirin 162 MG EC tablet Take 162 mg by mouth daily.      Marland Kitchen atorvastatin (LIPITOR) 80 MG tablet Take 1 tablet (80 mg total) by mouth daily.  90 tablet  3  . beta carotene 78295 UNIT capsule Take 25,000 Units by mouth daily.        . carvedilol (COREG) 12.5 MG tablet Take 1 tablet (12.5 mg total) by mouth 2 (two) times daily with a meal.  180 tablet  3  . enalapril (VASOTEC) 20 MG tablet Take 1 tablet (20 mg total) by mouth 2 (two) times daily.  180 tablet  3  . furosemide (LASIX) 40 MG tablet Take 1 tablet (40 mg total) by mouth 2 (two) times daily.  60 tablet  6  . HYDROcodone-acetaminophen (NORCO) 10-325 MG per tablet Take 2 tablets by mouth as needed.       . isosorbide mononitrate (  IMDUR) 30 MG 24 hr tablet Take 1 tablet (30 mg total) by mouth daily.  30 tablet  6  . nitroGLYCERIN (NITROSTAT) 0.4 MG SL tablet Place 0.4 mg under the tongue every 5 (five) minutes as needed.        Marland Kitchen spironolactone (ALDACTONE) 25 MG tablet Take 1 tablet (25 mg total) by mouth daily.  90 tablet  3  . warfarin (COUMADIN) 4 MG tablet Take 1 tablet (4 mg total) by mouth daily.  90 tablet  3  .  [DISCONTINUED] furosemide (LASIX) 40 MG tablet Take 1 tablet (40 mg total) by mouth daily.  30 tablet  6  . albuterol (PROVENTIL HFA;VENTOLIN HFA) 108 (90 BASE) MCG/ACT inhaler Inhale 2 puffs into the lungs every 6 (six) hours as needed for wheezing.  1 Inhaler  6   Review of Systems  Constitutional: Negative.   HENT: Negative.   Eyes: Negative.   Respiratory: Positive for shortness of breath.   Cardiovascular: Positive for leg swelling.  Gastrointestinal: Negative.   Musculoskeletal: Positive for gait problem.  Skin: Negative.   Neurological: Negative.   Psychiatric/Behavioral: Negative.   All other systems reviewed and are negative.   BP 135/70  Pulse 79  Ht 6\' 2"  (1.88 m)  Wt 175 lb (79.379 kg)  BMI 22.46 kg/m2 Oxygen saturation 96% on room air even with exertion, using his arms with weights Physical Exam  Nursing note and vitals reviewed. Constitutional: He is oriented to person, place, and time. He appears well-developed and well-nourished.  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  Nose: Nose normal.  Mouth/Throat: Oropharynx is clear and moist.  Eyes: Conjunctivae are normal. Pupils are equal, round, and reactive to light.  Neck: Normal range of motion. Neck supple. No JVD present.  Cardiovascular: Normal rate, regular rhythm, S1 normal, S2 normal, normal heart sounds and intact distal pulses.  Exam reveals no gallop and no friction rub.   No murmur heard. Pulses:      Carotid pulses are 2+ on the right side, and 2+ on the left side.      Radial pulses are 2+ on the right side, and 1+ on the left side.       Dorsalis pedis pulses are 1+ on the right side, and 1+ on the left side.       Posterior tibial pulses are 1+ on the right side, and 1+ on the left side.  1+ pitting edema of the left lower extremity  Pulmonary/Chest: Effort normal. No respiratory distress. He has decreased breath sounds.  He has no wheezes. He has no rales. He exhibits no tenderness.  Abdominal: Soft. Bowel sounds are normal. He exhibits no distension. There is no tenderness.  Musculoskeletal: Normal range of motion. He exhibits no edema and no tenderness.  Right leg amputation below the knee  Lymphadenopathy:    He has no cervical adenopathy.  Neurological: He is alert and oriented to person, place, and time. Coordination normal.  Skin: Skin is warm and dry. No rash noted. No erythema.  Psychiatric: He has a normal mood and affect. His behavior is normal. Judgment and thought content normal.      Assessment and Plan

## 2013-07-30 ENCOUNTER — Telehealth: Payer: Self-pay

## 2013-07-30 NOTE — Telephone Encounter (Signed)
Pt states he needs rx for oxygen. Please call

## 2013-07-30 NOTE — Telephone Encounter (Signed)
Spoke w/ pt.  Informed him that a letter was sent to his PCP, Dr. Beverely Risen, on 07/09/13 informing her of his request for O2 and he would need to contact her office to get a rx.

## 2013-10-15 ENCOUNTER — Ambulatory Visit: Payer: Medicare Other | Admitting: Cardiovascular Disease

## 2013-10-26 ENCOUNTER — Other Ambulatory Visit: Payer: Self-pay | Admitting: *Deleted

## 2013-10-26 MED ORDER — CARVEDILOL 12.5 MG PO TABS: 12.5000 mg | ORAL_TABLET | Freq: Two times a day (BID) | ORAL | Status: DC

## 2013-10-26 NOTE — Telephone Encounter (Signed)
Requested Prescriptions   Signed Prescriptions Disp Refills  . carvedilol (COREG) 12.5 MG tablet 180 tablet 3    Sig: Take 1 tablet (12.5 mg total) by mouth 2 (two) times daily with a meal.    Authorizing Provider: GOLLAN, TIMOTHY J    Ordering User: Myracle Febres C    

## 2013-10-30 ENCOUNTER — Ambulatory Visit (INDEPENDENT_AMBULATORY_CARE_PROVIDER_SITE_OTHER): Payer: Medicare Other | Admitting: Cardiovascular Disease

## 2013-10-30 ENCOUNTER — Encounter: Payer: Self-pay | Admitting: Cardiovascular Disease

## 2013-10-30 VITALS — BP 140/62 | HR 71 | Ht 74.0 in | Wt 165.0 lb

## 2013-10-30 DIAGNOSIS — I10 Essential (primary) hypertension: Secondary | ICD-10-CM

## 2013-10-30 DIAGNOSIS — E785 Hyperlipidemia, unspecified: Secondary | ICD-10-CM

## 2013-10-30 DIAGNOSIS — I2510 Atherosclerotic heart disease of native coronary artery without angina pectoris: Secondary | ICD-10-CM

## 2013-10-30 DIAGNOSIS — I509 Heart failure, unspecified: Secondary | ICD-10-CM

## 2013-10-30 DIAGNOSIS — I5022 Chronic systolic (congestive) heart failure: Secondary | ICD-10-CM

## 2013-10-30 MED ORDER — PROPRANOLOL HCL 10 MG PO TABS: 10.0000 mg | ORAL_TABLET | Freq: Three times a day (TID) | ORAL | Status: DC | PRN

## 2013-10-30 NOTE — Progress Notes (Signed)
Patient ID: Kyle Duke, male    DOB: 1940/12/01, 72 y.o.   MRN: [id number]  HPI Comments: Mr. Tuccillo is a pleasant 72 year-old gentleman with a long history of diabetes, smoking, three-vessel CAD, amputation of the right leg, previously declined CABG, who presents for routine followup, previous ejection fraction 30%, He is blind in left eye, doing laser treatments on the right   In followup today, he reports that he is doing well. No significant chest pain or shortness of breath. He no longer smokes. He uses oxygen at nighttime otherwise relatively active. Stable mild left lower extremity edema. Tolerating warfarin.   hospital admission 05/18/2013 for COPD exacerbation with improvement on prednisone and Levaquin . Currently he does not have significant symptoms of chest pain. He does have mild shortness of breath. Leg edema on the left. Still with a cough that is clear.  He Is not very active given his amputation. He does not wear his leg prosthesis and spends most of his time sitting.  He reports that he stop smoking, now uses electronic cigarette.  Previous admission to [facility] with chest discomfort, transferred to [facility] where he underwent numerous tests, including cardiac catheterization that showed severe three-vessel disease. He presented to [facility] January 2013 for hematuria, shortness of breath, better on oxygen. He was started on heparin and was noted to have significant hematuria. Heparin was discontinued and his warfarin was cut in half and his hematuria resolved. He had a non-STEMI, sinus tachycardia on arrival. Hemoglobin dropped as low as 8.4. Followup troponin was 5.2. He was discharged with medical management given severe three-vessel disease by catheterization at [facility] with recommendation at that time for medical management.  he declined bypass surgery. found to have an apical thrombus on echo.  Previous Cardiac catheter details show moderate diffuse RCA disease with severe  distal RCA disease, 90% mid left circumflex disease with diffusely diseased OM1 disease, 60% disease of an OM 2 vessel as large, occluded mid LAD, occluded D1, 90% D2 disease. Ejection fraction 30%. He was also found to have significant atherosclerotic disease of the abdominal aorta. Also found to have severe left subclavian stenosis ( significant blood pressure drop on the left compared to the right)  He declined bypass surgery as he has had an amputation in the past and is "trying to avoid additional invasive surgery". Stenting was not offered. He was found to have moderate LVH there was  apical thrombus on echo, normal right ventricular systolic pressures  EKG today shows normal sinus rhythm with rate 71 beats per minute with old anterior infarct, left anterior fascicular block  Echocardiogram January 2013 in the hospital showed ejection fraction 35-40%, moderate to severe apical wall hypokinesis, moderate anterior wall hypokinesis, normal right ventricular systolic pressures    Outpatient Encounter Prescriptions as of 10/30/2013  Medication Sig  . albuterol (PROVENTIL HFA;VENTOLIN HFA) 108 (90 BASE) MCG/ACT inhaler Inhale 2 puffs into the lungs every 6 (six) hours as needed for wheezing.  Marland Kitchen aspirin 162 MG EC tablet Take 162 mg by mouth daily.  Marland Kitchen atorvastatin (LIPITOR) 80 MG tablet Take 1 tablet (80 mg total) by mouth daily.  . beta carotene 78295 UNIT capsule Take 25,000 Units by mouth daily.    . carvedilol (COREG) 12.5 MG tablet Take 1 tablet (12.5 mg total) by mouth 2 (two) times daily with a meal.  . enalapril (VASOTEC) 20 MG tablet Take 1 tablet (20 mg total) by mouth 2 (two) times daily.  . furosemide (LASIX) 40  MG tablet Take 1 tablet (40 mg total) by mouth 2 (two) times daily.  Marland Kitchen HYDROcodone-acetaminophen (NORCO) 10-325 MG per tablet Take 2 tablets by mouth as needed.   . isosorbide mononitrate (IMDUR) 30 MG 24 hr tablet Take 1 tablet (30 mg total) by mouth daily.  . nitroGLYCERIN  (NITROSTAT) 0.4 MG SL tablet Place 0.4 mg under the tongue every 5 (five) minutes as needed.    Marland Kitchen spironolactone (ALDACTONE) 25 MG tablet Take 1 tablet (25 mg total) by mouth daily.  . SYMBICORT 160-4.5 MCG/ACT inhaler Inhale 2 puffs into the lungs as needed.   . warfarin (COUMADIN) 4 MG tablet Take 1 tablet (4 mg total) by mouth daily.    Review of Systems  Constitutional: Negative.   HENT: Negative.   Eyes: Negative.   Respiratory: Positive for shortness of breath.   Cardiovascular: Positive for leg swelling.  Gastrointestinal: Negative.   Musculoskeletal: Positive for gait problem.  Skin: Negative.   Neurological: Negative.   Psychiatric/Behavioral: Negative.   All other systems reviewed and are negative.   BP 140/62  Pulse 71  Ht 6\' 2"  (1.88 m)  Wt 165 lb (74.844 kg)  BMI 21.18 kg/m2  Physical Exam  Nursing note and vitals reviewed. Constitutional: He is oriented to person, place, and time. He appears well-developed and well-nourished.  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  Nose: Nose normal.  Mouth/Throat: Oropharynx is clear and moist.  Eyes: Conjunctivae are normal. Pupils are equal, round, and reactive to light.  Neck: Normal range of motion. Neck supple. No JVD present.  Cardiovascular: Normal rate, regular rhythm, S1 normal, S2 normal, normal heart sounds and intact distal pulses.  Exam reveals no gallop and no friction rub.   No murmur heard. Pulses:      Carotid pulses are 2+ on the right side, and 2+ on the left side.      Radial pulses are 2+ on the right side, and 1+ on the left side.       Dorsalis pedis pulses are 1+ on the right side, and 1+ on the left side.       Posterior tibial pulses are 1+ on the right side, and 1+ on the left side.  1+ pitting edema of the left lower extremity  Pulmonary/Chest: Effort normal. No respiratory distress. He has decreased breath sounds. He  has no wheezes. He has no rales. He exhibits no tenderness.  Abdominal: Soft. Bowel sounds are normal. He exhibits no distension. There is no tenderness.  Musculoskeletal: Normal range of motion. He exhibits no edema and no tenderness.  Right leg amputation below the knee  Lymphadenopathy:    He has no cervical adenopathy.  Neurological: He is alert and oriented to person, place, and time. Coordination normal.  Skin: Skin is warm and dry. No rash noted. No erythema.  Psychiatric: He has a normal mood and affect. His behavior is normal. Judgment and thought content normal.      Assessment and Plan

## 2013-10-30 NOTE — Assessment & Plan Note (Signed)
Encouraged him to stay on his Lasix twice a day. Take additional Lasix for worsening edema

## 2013-10-30 NOTE — Assessment & Plan Note (Signed)
Currently with no symptoms of angina. No further workup at this time. Continue current medication regimen. 

## 2013-10-30 NOTE — Patient Instructions (Addendum)
You are doing well. No medication changes were made.  Please call us if you have new issues that need to be addressed before your next appt.  Your physician wants you to follow-up in: 6 months.  You will receive a reminder letter in the mail two months in advance. If you don't receive a letter, please call our office to schedule the follow-up appointment.   

## 2013-10-30 NOTE — Assessment & Plan Note (Signed)
Cholesterol is at goal on the current lipid regimen. No changes to the medications were made.  

## 2013-10-30 NOTE — Assessment & Plan Note (Signed)
Blood pressure is well controlled on today's visit. No changes made to the medications. 

## 2013-12-23 ENCOUNTER — Other Ambulatory Visit: Payer: Self-pay | Admitting: *Deleted

## 2013-12-23 MED ORDER — ATORVASTATIN CALCIUM 80 MG PO TABS: 80.0000 mg | ORAL_TABLET | Freq: Every day | ORAL | Status: AC

## 2013-12-23 NOTE — Telephone Encounter (Signed)
Requested Prescriptions   Signed Prescriptions Disp Refills  . atorvastatin (LIPITOR) 80 MG tablet 90 tablet 3    Sig: Take 1 tablet (80 mg total) by mouth daily.    Authorizing Provider: GOLLAN, TIMOTHY J    Ordering User: LOPEZ, MARINA C    

## 2014-01-26 ENCOUNTER — Other Ambulatory Visit: Payer: Self-pay

## 2014-01-26 MED ORDER — ISOSORBIDE MONONITRATE ER 30 MG PO TB24: 30.0000 mg | ORAL_TABLET | Freq: Every day | ORAL | Status: DC

## 2014-01-26 NOTE — Telephone Encounter (Signed)
Refill sent for isosorbide. 

## 2014-02-22 ENCOUNTER — Other Ambulatory Visit: Payer: Self-pay

## 2014-02-22 MED ORDER — SPIRONOLACTONE 25 MG PO TABS: 25.0000 mg | ORAL_TABLET | Freq: Every day | ORAL | Status: AC

## 2014-02-22 NOTE — Telephone Encounter (Signed)
Refill sent for spironolactone  

## 2014-06-25 ENCOUNTER — Other Ambulatory Visit: Payer: Self-pay

## 2014-06-25 MED ORDER — ENALAPRIL MALEATE 20 MG PO TABS: 20.0000 mg | ORAL_TABLET | Freq: Two times a day (BID) | ORAL | Status: AC

## 2014-06-25 NOTE — Telephone Encounter (Signed)
Refill sent for enalapril 20 mg  

## 2014-07-09 ENCOUNTER — Ambulatory Visit: Payer: Self-pay

## 2014-07-14 ENCOUNTER — Ambulatory Visit: Payer: Medicare Other | Admitting: Cardiovascular Disease

## 2014-08-05 ENCOUNTER — Other Ambulatory Visit: Payer: Self-pay

## 2014-08-05 MED ORDER — FUROSEMIDE 40 MG PO TABS: 40.0000 mg | ORAL_TABLET | Freq: Two times a day (BID) | ORAL | Status: AC

## 2014-08-05 NOTE — Telephone Encounter (Signed)
Refill sent for furosemide 40 mg.  

## 2014-08-11 ENCOUNTER — Ambulatory Visit: Payer: Medicare Other | Admitting: Cardiovascular Disease

## 2014-09-06 ENCOUNTER — Ambulatory Visit: Payer: Medicare Other | Admitting: Cardiovascular Disease

## 2014-11-02 ENCOUNTER — Ambulatory Visit: Payer: Self-pay | Admitting: Internal Medicine

## 2014-12-01 ENCOUNTER — Other Ambulatory Visit: Payer: Self-pay | Admitting: Cardiovascular Disease

## 2014-12-06 ENCOUNTER — Other Ambulatory Visit: Payer: Self-pay | Admitting: Cardiovascular Disease

## 2014-12-06 DIAGNOSIS — I509 Heart failure, unspecified: Secondary | ICD-10-CM | POA: Diagnosis not present

## 2014-12-27 DIAGNOSIS — I509 Heart failure, unspecified: Secondary | ICD-10-CM | POA: Diagnosis not present

## 2015-01-06 DIAGNOSIS — I509 Heart failure, unspecified: Secondary | ICD-10-CM | POA: Diagnosis not present

## 2015-01-07 ENCOUNTER — Other Ambulatory Visit: Payer: Self-pay | Admitting: Emergency Medicine

## 2015-01-07 DIAGNOSIS — R791 Abnormal coagulation profile: Secondary | ICD-10-CM | POA: Diagnosis not present

## 2015-01-07 DIAGNOSIS — I739 Peripheral vascular disease, unspecified: Secondary | ICD-10-CM | POA: Diagnosis not present

## 2015-01-07 DIAGNOSIS — I808 Phlebitis and thrombophlebitis of other sites: Secondary | ICD-10-CM | POA: Diagnosis not present

## 2015-01-07 DIAGNOSIS — I4891 Unspecified atrial fibrillation: Secondary | ICD-10-CM | POA: Diagnosis not present

## 2015-01-07 DIAGNOSIS — I440 Atrioventricular block, first degree: Secondary | ICD-10-CM | POA: Diagnosis not present

## 2015-01-07 DIAGNOSIS — E785 Hyperlipidemia, unspecified: Secondary | ICD-10-CM | POA: Diagnosis not present

## 2015-01-07 DIAGNOSIS — J9620 Acute and chronic respiratory failure, unspecified whether with hypoxia or hypercapnia: Secondary | ICD-10-CM | POA: Diagnosis not present

## 2015-01-07 DIAGNOSIS — Z515 Encounter for palliative care: Secondary | ICD-10-CM | POA: Diagnosis not present

## 2015-01-07 DIAGNOSIS — R633 Feeding difficulties: Secondary | ICD-10-CM | POA: Diagnosis not present

## 2015-01-07 DIAGNOSIS — E861 Hypovolemia: Secondary | ICD-10-CM | POA: Diagnosis not present

## 2015-01-07 DIAGNOSIS — R531 Weakness: Secondary | ICD-10-CM | POA: Diagnosis not present

## 2015-01-07 DIAGNOSIS — J189 Pneumonia, unspecified organism: Secondary | ICD-10-CM | POA: Diagnosis not present

## 2015-01-07 DIAGNOSIS — I4901 Ventricular fibrillation: Secondary | ICD-10-CM | POA: Diagnosis not present

## 2015-01-07 DIAGNOSIS — N186 End stage renal disease: Secondary | ICD-10-CM | POA: Diagnosis not present

## 2015-01-07 DIAGNOSIS — J9601 Acute respiratory failure with hypoxia: Secondary | ICD-10-CM | POA: Diagnosis not present

## 2015-01-07 DIAGNOSIS — I120 Hypertensive chronic kidney disease with stage 5 chronic kidney disease or end stage renal disease: Secondary | ICD-10-CM | POA: Diagnosis not present

## 2015-01-07 DIAGNOSIS — R579 Shock, unspecified: Secondary | ICD-10-CM | POA: Diagnosis not present

## 2015-01-07 DIAGNOSIS — N189 Chronic kidney disease, unspecified: Secondary | ICD-10-CM | POA: Diagnosis not present

## 2015-01-07 DIAGNOSIS — N183 Chronic kidney disease, stage 3 (moderate): Secondary | ICD-10-CM | POA: Diagnosis not present

## 2015-01-07 DIAGNOSIS — T68XXXA Hypothermia, initial encounter: Secondary | ICD-10-CM | POA: Diagnosis not present

## 2015-01-07 DIAGNOSIS — A419 Sepsis, unspecified organism: Secondary | ICD-10-CM | POA: Diagnosis not present

## 2015-01-07 DIAGNOSIS — Z452 Encounter for adjustment and management of vascular access device: Secondary | ICD-10-CM | POA: Diagnosis not present

## 2015-01-07 DIAGNOSIS — Z9911 Dependence on respirator [ventilator] status: Secondary | ICD-10-CM | POA: Diagnosis not present

## 2015-01-07 DIAGNOSIS — E43 Unspecified severe protein-calorie malnutrition: Secondary | ICD-10-CM | POA: Diagnosis not present

## 2015-01-07 DIAGNOSIS — D631 Anemia in chronic kidney disease: Secondary | ICD-10-CM | POA: Diagnosis not present

## 2015-01-07 DIAGNOSIS — J9600 Acute respiratory failure, unspecified whether with hypoxia or hypercapnia: Secondary | ICD-10-CM | POA: Diagnosis not present

## 2015-01-07 DIAGNOSIS — J90 Pleural effusion, not elsewhere classified: Secondary | ICD-10-CM | POA: Diagnosis not present

## 2015-01-07 DIAGNOSIS — N179 Acute kidney failure, unspecified: Secondary | ICD-10-CM | POA: Diagnosis not present

## 2015-01-07 DIAGNOSIS — G9340 Encephalopathy, unspecified: Secondary | ICD-10-CM | POA: Diagnosis not present

## 2015-01-07 DIAGNOSIS — G629 Polyneuropathy, unspecified: Secondary | ICD-10-CM | POA: Diagnosis not present

## 2015-01-07 DIAGNOSIS — Z992 Dependence on renal dialysis: Secondary | ICD-10-CM | POA: Diagnosis not present

## 2015-01-07 DIAGNOSIS — I213 ST elevation (STEMI) myocardial infarction of unspecified site: Secondary | ICD-10-CM | POA: Diagnosis not present

## 2015-01-07 DIAGNOSIS — I509 Heart failure, unspecified: Secondary | ICD-10-CM | POA: Diagnosis not present

## 2015-01-07 DIAGNOSIS — I340 Nonrheumatic mitral (valve) insufficiency: Secondary | ICD-10-CM | POA: Diagnosis not present

## 2015-01-07 DIAGNOSIS — R570 Cardiogenic shock: Secondary | ICD-10-CM | POA: Diagnosis not present

## 2015-01-07 DIAGNOSIS — I5022 Chronic systolic (congestive) heart failure: Secondary | ICD-10-CM | POA: Diagnosis not present

## 2015-01-07 DIAGNOSIS — I469 Cardiac arrest, cause unspecified: Secondary | ICD-10-CM | POA: Diagnosis not present

## 2015-01-07 DIAGNOSIS — I5020 Unspecified systolic (congestive) heart failure: Secondary | ICD-10-CM | POA: Diagnosis not present

## 2015-01-07 DIAGNOSIS — Z720 Tobacco use: Secondary | ICD-10-CM | POA: Diagnosis not present

## 2015-01-07 DIAGNOSIS — J9690 Respiratory failure, unspecified, unspecified whether with hypoxia or hypercapnia: Secondary | ICD-10-CM | POA: Diagnosis not present

## 2015-01-07 DIAGNOSIS — N2581 Secondary hyperparathyroidism of renal origin: Secondary | ICD-10-CM | POA: Diagnosis not present

## 2015-01-07 DIAGNOSIS — Z66 Do not resuscitate: Secondary | ICD-10-CM | POA: Diagnosis not present

## 2015-01-07 DIAGNOSIS — Z89611 Acquired absence of right leg above knee: Secondary | ICD-10-CM | POA: Diagnosis not present

## 2015-01-07 DIAGNOSIS — E1129 Type 2 diabetes mellitus with other diabetic kidney complication: Secondary | ICD-10-CM | POA: Diagnosis not present

## 2015-01-07 DIAGNOSIS — D689 Coagulation defect, unspecified: Secondary | ICD-10-CM | POA: Diagnosis not present

## 2015-01-07 DIAGNOSIS — J449 Chronic obstructive pulmonary disease, unspecified: Secondary | ICD-10-CM | POA: Diagnosis not present

## 2015-01-07 DIAGNOSIS — J984 Other disorders of lung: Secondary | ICD-10-CM | POA: Diagnosis not present

## 2015-01-07 DIAGNOSIS — Z4682 Encounter for fitting and adjustment of non-vascular catheter: Secondary | ICD-10-CM | POA: Diagnosis not present

## 2015-01-07 DIAGNOSIS — I959 Hypotension, unspecified: Secondary | ICD-10-CM | POA: Diagnosis not present

## 2015-01-07 DIAGNOSIS — E875 Hyperkalemia: Secondary | ICD-10-CM | POA: Diagnosis not present

## 2015-01-07 DIAGNOSIS — R6521 Severe sepsis with septic shock: Secondary | ICD-10-CM | POA: Diagnosis not present

## 2015-01-09 ENCOUNTER — Other Ambulatory Visit: Payer: Self-pay | Admitting: Internal Medicine

## 2015-01-09 DIAGNOSIS — J9600 Acute respiratory failure, unspecified whether with hypoxia or hypercapnia: Secondary | ICD-10-CM

## 2015-01-09 DIAGNOSIS — R7989 Other specified abnormal findings of blood chemistry: Secondary | ICD-10-CM

## 2015-01-09 DIAGNOSIS — I340 Nonrheumatic mitral (valve) insufficiency: Secondary | ICD-10-CM

## 2015-01-09 DIAGNOSIS — N179 Acute kidney failure, unspecified: Secondary | ICD-10-CM

## 2015-01-09 DIAGNOSIS — N189 Chronic kidney disease, unspecified: Secondary | ICD-10-CM

## 2015-01-09 DIAGNOSIS — I469 Cardiac arrest, cause unspecified: Secondary | ICD-10-CM

## 2015-01-10 DIAGNOSIS — I213 ST elevation (STEMI) myocardial infarction of unspecified site: Secondary | ICD-10-CM

## 2015-01-13 DIAGNOSIS — I5020 Unspecified systolic (congestive) heart failure: Secondary | ICD-10-CM

## 2015-01-15 DIAGNOSIS — I4891 Unspecified atrial fibrillation: Secondary | ICD-10-CM

## 2015-01-25 ENCOUNTER — Ambulatory Visit
Admit: 2015-01-25 | Disposition: A | Discharge status: Home / Self Care | Payer: Self-pay | Attending: Internal Medicine | Admitting: Internal Medicine

## 2015-01-25 DEATH — deceased

## 2015-06-30 IMAGING — CR DG CHEST 2V
1 series · 3 of 3 positions shown · non-contrast
Comparison: 11/02/2014

CLINICAL DATA: Difficulty breathing for 1 day. History of COPD.
Coughing.

EXAM:
CHEST  2 VIEW

[Series 1: dxr chest pa (or ap) and lateral · 0.14mm/px · 3 of 3 slices shown]
[im 1/3]
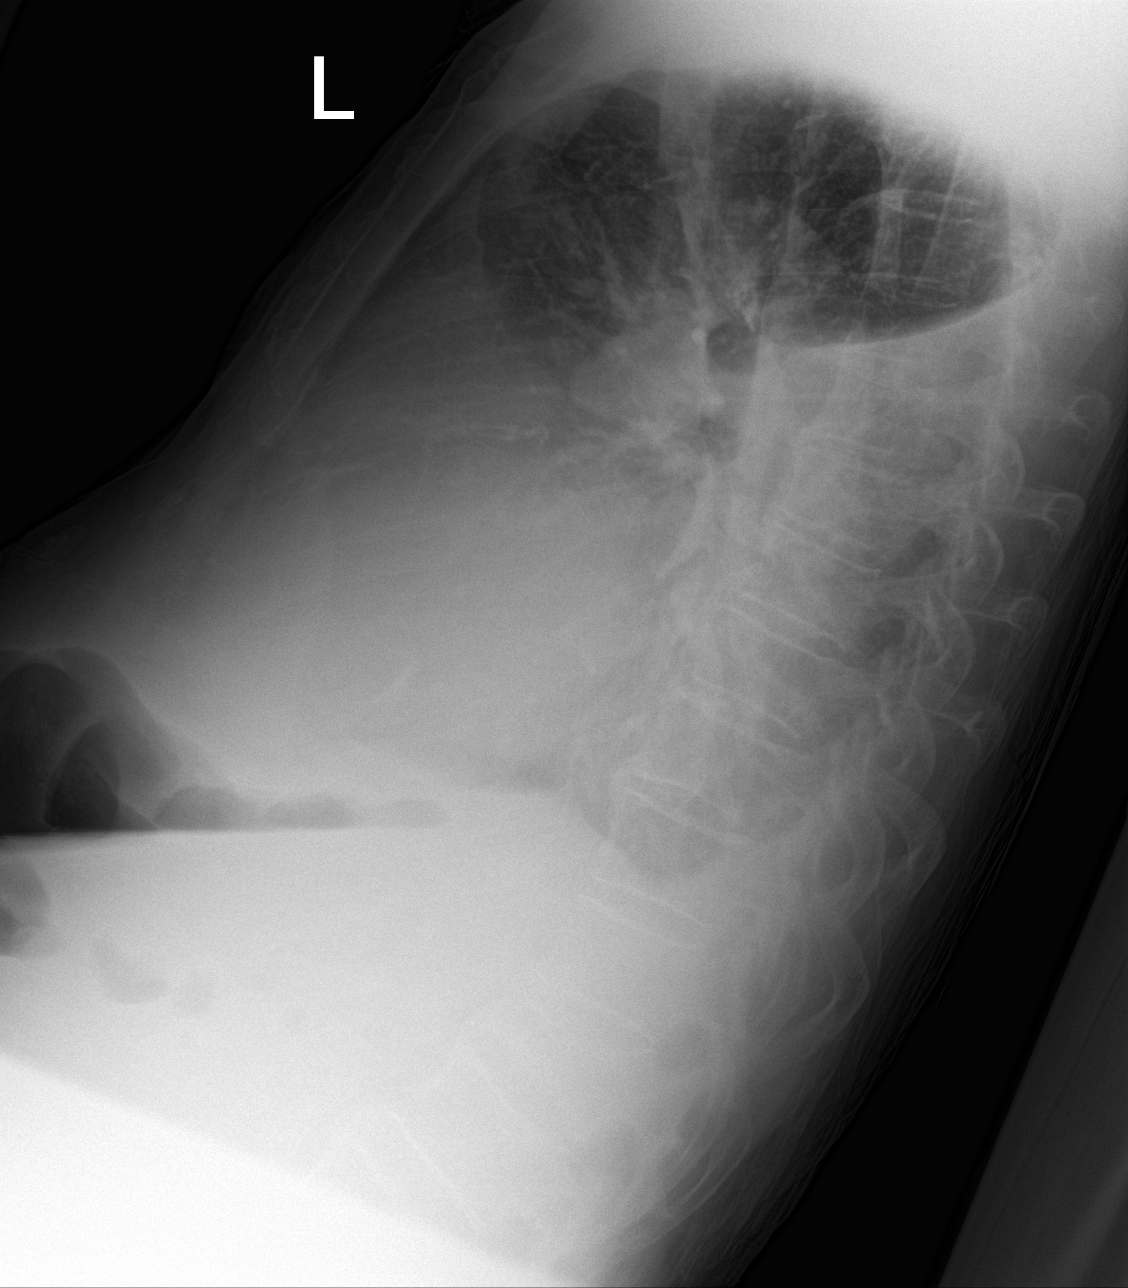
[im 2/3]
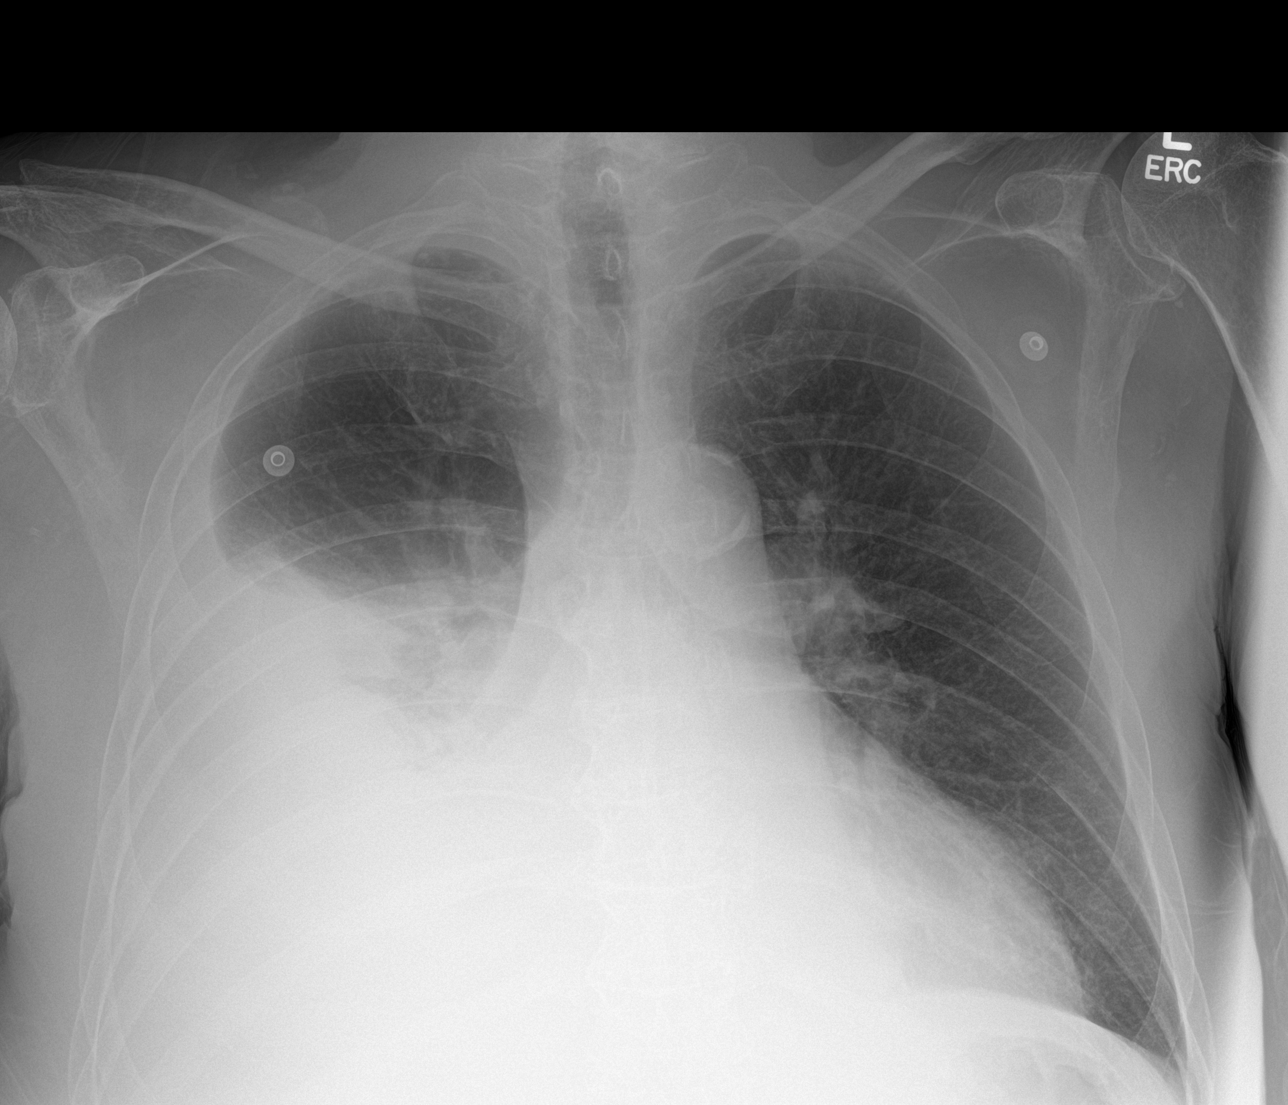
[im 3/3]
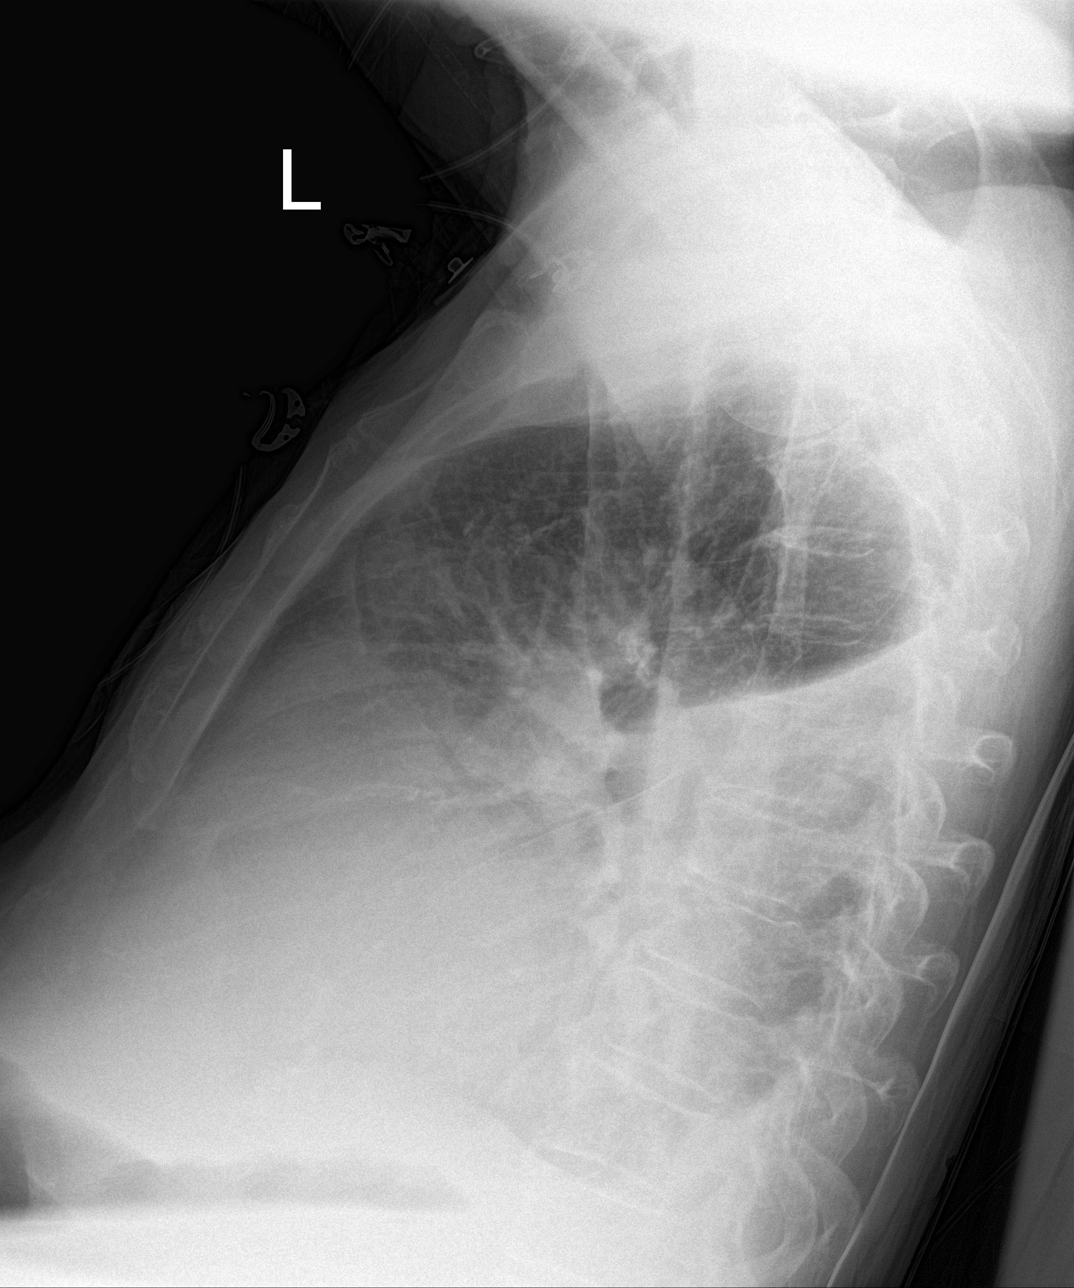

[3 of 3 positions shown; findings below may reference images not displayed]

FINDINGS: There is dense opacity at the right lung base consistent with
effusion and atelectasis or infiltrate. Suspect developing density
in the medial left lung base this issue with small left pleural
effusion. There is no pulmonary edema. Wedge compression fracture of
L1 probably chronic.
IMPRESSION: 1. Persistent significant right lung base density and right pleural
effusion.
2. Suspect developing medial left lower lobe infiltrate and left
effusion.
